# Patient Record
Sex: Female | Born: 1970 | Hispanic: Yes | Marital: Single | Smoking: Current every day smoker
Health system: Southern US, Community
[De-identification: ages and names within clinical notes are randomized; demographics above are authoritative.]

---

## 2015-08-03 ENCOUNTER — Ambulatory Visit (INDEPENDENT_AMBULATORY_CARE_PROVIDER_SITE_OTHER): Payer: Worker's Compensation | Admitting: Emergency Medicine

## 2015-08-03 ENCOUNTER — Ambulatory Visit: Payer: Worker's Compensation

## 2015-08-03 VITALS — BP 98/64 | HR 77 | Temp 98.0°F | Resp 16 | Ht 62.5 in | Wt 162.0 lb

## 2015-08-03 DIAGNOSIS — M545 Low back pain, unspecified: Secondary | ICD-10-CM

## 2015-08-03 DIAGNOSIS — M79641 Pain in right hand: Secondary | ICD-10-CM | POA: Diagnosis not present

## 2015-08-03 MED ORDER — NAPROXEN SODIUM 550 MG PO TABS: 550.0000 mg | ORAL_TABLET | Freq: Two times a day (BID) | ORAL | Status: DC

## 2015-08-03 NOTE — Progress Notes (Signed)
Subjective:  Patient ID: Roberta Barnes, female    DOB: 11/23/1970  Age: 45 y.o. MRN: [id number]  CC: tail bone and Arm Injury   HPI Roberta Barnes presents   Slipped and fell at work she landed on her tail and on her outstretched right hand. She has pain in her coccyx and also in her right base of her thumb pain radiation no numbness tingling or weakness in her legs. She denies any other complaints. She able to ambulate and is not incontinent of urine or stool  History Roberta SimmerFrancisca has no past medical history on file.   She has no past surgical history on file.   Her  family history is not on file.  She   reports that she has been smoking Cigarettes.  She has a 1.2 pack-year smoking history. She does not have any smokeless tobacco history on file. Her alcohol and drug histories are not on file.  No outpatient prescriptions prior to visit.   No facility-administered medications prior to visit.    Social History   Social History  . Marital Status: Single    Spouse Name: N/A  . Number of Children: N/A  . Years of Education: N/A   Social History Main Topics  . Smoking status: Current Every Day Smoker -- 0.30 packs/day for 4 years    Types: Cigarettes  . Smokeless tobacco: None  . Alcohol Use: None  . Drug Use: None  . Sexual Activity: Not Asked   Other Topics Concern  . None   Social History Narrative  . None     Review of Systems  Constitutional: Negative for fever, chills and appetite change.  HENT: Negative for congestion, ear pain, postnasal drip, sinus pressure and sore throat.   Eyes: Negative for pain and redness.  Respiratory: Negative for cough, shortness of breath and wheezing.   Cardiovascular: Negative for leg swelling.  Gastrointestinal: Negative for nausea, vomiting, abdominal pain, diarrhea, constipation and blood in stool.  Endocrine: Negative for polyuria.  Genitourinary: Negative for dysuria, urgency, frequency and flank  pain.  Musculoskeletal: Positive for back pain. Negative for gait problem.  Skin: Negative for rash.  Neurological: Negative for weakness and headaches.  Psychiatric/Behavioral: Negative for confusion and decreased concentration. The patient is not nervous/anxious.     Objective:  BP 98/64 mmHg  Pulse 77  Temp(Src) 98 F (36.7 C) (Oral)  Resp 16  Ht 5' 2.5" (1.588 m)  Wt 162 lb (73.483 kg)  BMI 29.14 kg/m2  SpO2 98%  LMP 07/27/2015  Physical Exam  Constitutional: She is oriented to person, place, and time. She appears well-developed and well-nourished. No distress.  HENT:  Head: Normocephalic and atraumatic.  Right Ear: External ear normal.  Left Ear: External ear normal.  Nose: Nose normal.  Eyes: Conjunctivae and EOM are normal. Pupils are equal, round, and reactive to light. No scleral icterus.  Neck: Normal range of motion. Neck supple. No tracheal deviation present.  Cardiovascular: Normal rate, regular rhythm and normal heart sounds.   Pulmonary/Chest: Effort normal. No respiratory distress. She has no wheezes. She has no rales.  Abdominal: She exhibits no mass. There is no tenderness. There is no rebound and no guarding.  Musculoskeletal: She exhibits no edema.       Lumbar back: She exhibits tenderness.   She has pain and tenderness in base of her thumb and guards. She has no crepitus. There is moderate swelling  Lymphadenopathy:    She has no cervical adenopathy.  Neurological:  She is alert and oriented to person, place, and time. Coordination normal.  Skin: Skin is warm and dry. No rash noted.  Psychiatric: She has a normal mood and affect. Her behavior is normal.      Assessment & Plan:   Roberta Barnes was seen today for tail bone and arm injury.  Diagnoses and all orders for this visit:  Right-sided low back pain without sciatica -     DG Hand Complete Right; Future -     DG Sacrum/Coccyx; Future  Pain of right hand -     DG Hand Complete Right; Future -      DG Sacrum/Coccyx; Future  Other orders -     naproxen sodium (ANAPROX DS) 550 MG tablet; Take 1 tablet (550 mg total) by mouth 2 (two) times daily with a meal.  I am having Roberta Barnes start on naproxen sodium. I am also having her maintain her HYDROcodone-homatropine.  Meds ordered this encounter  Medications  . HYDROcodone-homatropine (HYCODAN) 5-1.5 MG/5ML syrup    Sig: Take 5 mLs by mouth every 6 (six) hours as needed for cough.  . naproxen sodium (ANAPROX DS) 550 MG tablet    Sig: Take 1 tablet (550 mg total) by mouth 2 (two) times daily with a meal.    Dispense:  40 tablet    Refill:  0    Appropriate red flag conditions were discussed with the patient as well as actions that should be taken.  Patient expressed his understanding.  Follow-up: Return in about 1 week (around 08/10/2015).  Carmelina Dane, MD   [facility] reading (PRIMARY) by  Dr. Dareen Piano.  negative.

## 2015-08-03 NOTE — Patient Instructions (Signed)
Lesin en el cccix (Tailbone Injury) El cccix es el hueso pequeo que se encuentra en el extremo inferior de la columna vertebral. Una lesin en el cccix puede incluir la distensin de los ligamentos, hematomas o un hueso quebrado (fractura). Las lesiones en el cccix pueden ser Chesapeake Energydolorosas, y algunas tardan mucho tiempo en curarse. CAUSAS A menudo, esta afeccin se debe a una Emerson Electriccada sobre el cccix. Otras causas son las siguientes:  Esfuerzo o friccin reiterados por Education officer, environmentalrealizar acciones tales Engineer, materialscomo remar y Lobbyistandar en bicicleta.  El Lyndonparto. En algunos casos, es posible que la causa no se conozca. FACTORES DE RIESGO Esta afeccin es ms comn en las mujeres que en los hombres. SNTOMAS Los sntomas de esta afeccin incluyen lo siguiente:  Dolor en la parte baja de la espalda, especialmente al sentarse.  Dolor o dificultad al ponerse de pie despus de estar sentado.  Hematomas en la zona del cccix.  Dolor al defecar.  En las mujeres, dolor durante el coito. DIAGNSTICO Esta afeccin se puede diagnosticar en funcin de los sntomas y de un examen fsico. Si se sospecha que hay una fractura, se pueden tomar radiografas. Tambin pueden hacerle otros estudios, como una tomografa computarizada (TC) o una resonancia magntica (RM). TRATAMIENTO Esta afeccin se puede tratar con medicamentos para ayudar a Engineer, materialsaliviar el dolor. La mayora de las lesiones en el cccix se curan solas en el trmino de 4 a 6semanas. Sin embargo, Museum/gallery conservatorel tiempo de recuperacin puede ser ms prolongado si la lesin incluye Market researcheruna fractura. INSTRUCCIONES PARA EL CUIDADO EN EL HOGAR  Tome los medicamentos solamente como se lo haya indicado el mdico.  Si se lo indican, aplique hielo sobre la zona lesionada:  Ponga el hielo en una bolsa plstica.  Coloque una toalla entre la piel y la bolsa de hielo.  Deje el hielo durante 20minutos, 2 a 3veces por da, durante los primeros 1 o 2das.  Sintese sobre un aro o un almohadn  grande de goma o inflable para Engineer, materialsaliviar el dolor. Inclnese hacia adelante cuando est sentado para ayudar a Altria Groupaliviar las molestias.  Evite estar sentado durante largos perodos.  Aumente la actividad a medida que el dolor se lo permita. Haga los ejercicios que el mdico o el fisioterapeuta le recomienden.  Si tiene dolor al Tesoro Corporationdefecar, tome un laxante emoliente como se lo haya indicado el mdico.  Consuma una dieta con gran cantidad de fibra para ayudar a evitar el estreimiento.  Concurra a todas las visitas de control como se lo haya indicado el mdico. Esto es importante. PREVENCIN Use elementos acolchados y equipo deportivo cuando ande en bicicleta o reme. Esto puede ayudar a evitar que tenga una lesin causada por el esfuerzo o la friccin reiterados. SOLICITE ATENCIN MDICA SI:  El dolor empeora.  Siente muchas molestias al Tesoro Corporationdefecar.  No puede defecar.  Tiene prdidas de orina que no puede controlar (incontinencia urinaria).  Tiene fiebre.   Esta informacin no tiene Theme park managercomo fin reemplazar el consejo del mdico. Asegrese de hacerle al mdico cualquier pregunta que tenga.   Document Released: 04/25/2005 Document Revised: 11/30/2014 Elsevier Interactive Patient Education [employer]2016 Elsevier Inc.

## 2015-08-10 ENCOUNTER — Ambulatory Visit (INDEPENDENT_AMBULATORY_CARE_PROVIDER_SITE_OTHER): Payer: Worker's Compensation | Admitting: Emergency Medicine

## 2015-08-10 ENCOUNTER — Ambulatory Visit: Payer: Worker's Compensation

## 2015-08-10 VITALS — BP 102/68 | HR 85 | Temp 98.8°F | Wt 162.0 lb

## 2015-08-10 DIAGNOSIS — M25531 Pain in right wrist: Secondary | ICD-10-CM | POA: Diagnosis not present

## 2015-08-10 DIAGNOSIS — S63601D Unspecified sprain of right thumb, subsequent encounter: Secondary | ICD-10-CM | POA: Diagnosis not present

## 2015-08-10 NOTE — Progress Notes (Signed)
Subjective:  Patient ID: Roberta Barnes, female    DOB: June 28, 1971  Age: 45 y.o. MRN: [id number]  CC: Follow-up and Wrist Pain   HPI Roberta Barnes presents  patients come back on workman's comp visit she had injured her right wrist and low back in a fall a week ago. Progressed well from the standpoint of her back pain. That's largely resolved she has no numbness tingling or weakness or radicular pain.  Her wrist is still painful even though she is using a thumb spica splint. It does limit her activity. She is more cough when she is in the splint that out.  History Roberta Barnes has no past medical history on file.   She has no past surgical history on file.   Her  family history is not on file.  She   reports that she has been smoking Cigarettes.  She has a 1.2 pack-year smoking history. She does not have any smokeless tobacco history on file. Her alcohol and drug histories are not on file.  Outpatient Prescriptions Prior to Visit  Medication Sig Dispense Refill  . HYDROcodone-homatropine (HYCODAN) 5-1.5 MG/5ML syrup Take 5 mLs by mouth every 6 (six) hours as needed for cough.    . naproxen sodium (ANAPROX DS) 550 MG tablet Take 1 tablet (550 mg total) by mouth 2 (two) times daily with a meal. 40 tablet 0   No facility-administered medications prior to visit.    Social History   Social History  . Marital Status: Single    Spouse Name: N/A  . Number of Children: N/A  . Years of Education: N/A   Social History Main Topics  . Smoking status: Current Every Day Smoker -- 0.30 packs/day for 4 years    Types: Cigarettes  . Smokeless tobacco: None  . Alcohol Use: None  . Drug Use: None  . Sexual Activity: Not Asked   Other Topics Concern  . None   Social History Narrative     Review of Systems  Constitutional: Negative for fever, chills and appetite change.  HENT: Negative for congestion, ear pain, postnasal drip, sinus pressure and sore throat.    Eyes: Negative for pain and redness.  Respiratory: Negative for cough, shortness of breath and wheezing.   Cardiovascular: Negative for leg swelling.  Gastrointestinal: Negative for nausea, vomiting, abdominal pain, diarrhea, constipation and blood in stool.  Endocrine: Negative for polyuria.  Genitourinary: Negative for dysuria, urgency, frequency and flank pain.  Musculoskeletal: Negative for gait problem.  Skin: Negative for rash.  Neurological: Negative for weakness and headaches.  Psychiatric/Behavioral: Negative for confusion and decreased concentration. The patient is not nervous/anxious.     Objective:  BP 102/68 mmHg  Pulse 85  Temp(Src) 98.8 F (37.1 C) (Oral)  Wt 162 lb (73.483 kg)  SpO2 98%  LMP 07/27/2015  Physical Exam  Constitutional: She is oriented to person, place, and time. She appears well-developed and well-nourished.  HENT:  Head: Normocephalic and atraumatic.  Eyes: Conjunctivae are normal. Pupils are equal, round, and reactive to light.  Pulmonary/Chest: Effort normal.  Musculoskeletal: She exhibits no edema.       Right wrist: She exhibits tenderness. She exhibits no swelling, no effusion, no crepitus and no deformity.  Neurological: She is alert and oriented to person, place, and time.  Skin: Skin is dry.  Psychiatric: She has a normal mood and affect. Her behavior is normal. Thought content normal.   She has tenderness over the anatomic snuffbox.   Assessment & Plan:  Roberta Barnes was seen today for follow-up and wrist pain.  Diagnoses and all orders for this visit:  Right wrist pain -     DG Wrist Complete Right; Future  Sprain, thumb, right, subsequent encounter   I am having Roberta Barnes maintain her HYDROcodone-homatropine and naproxen sodium.  No orders of the defined types were placed in this encounter.    Appropriate red flag conditions were discussed with the patient as well as actions that should be taken.  Patient  expressed his understanding.  Follow-up: Return in about 1 week (around 08/17/2015).  [name], [name], MD  [facility] reading (PRIMARY) by  Dr. Burna SisAnderson   Negative .

## 2015-08-10 NOTE — Patient Instructions (Signed)
Esguince de pulgar (Thumb Sprain) Un esguince de pulgar es una lesin en una de las bandas de tejido fuertes (ligamentos) que conectan los huesos del Engineer, productionpulgar. El ligamento puede distenderse en exceso o romperse. La rotura puede ser parcial o completa. La gravedad del esguince depende de la magnitud del dao o de la rotura del ligamento. CAUSAS Con frecuencia, el esguince de pulgar se debe a una cada o a un accidente. Si se extienden las manos para atrapar un objeto o para protegerse, la fuerza del impacto puede causar la distensin excesiva del ligamento. Este exceso de tensin tambin puede causar la rotura del ligamento. FACTORES DE RIESGO Es ms probable que esta lesin se produzca en las personas que practican los siguientes deportes:  Aquellos que suponen un riesgo mayor de sufrir cadas, como el esqu.  Aquellos que Consecoincluyen el atrapamiento de un objeto, por ejemplo, el baloncesto. SNTOMAS Los sntomas de esta afeccin incluyen lo siguiente:  Prdida de la movilidad del pulgar.  Hematomas.  Dolor a Insurance claims handlerla palpacin.  Hinchazn. DIAGNSTICO Esta afeccin se diagnostica mediante la historia clnica y un examen fsico. Adems, pueden hacerle una radiografa del pulgar. TRATAMIENTO El tratamiento vara en funcin de la gravedad del esguince. Si se produce la sobreexigencia del ligamento o este est parcialmente roto, el tratamiento suele incluir la inmovilizacin del pulgar durante un lapso de [place] Estatestiempo. Para ello, el mdico colocar una venda, un yeso o una frula para impedir que el pulgar se mueva hasta que se cure. Si la rotura del ligamento es total, tal vez deba someterse a una ciruga para Economistreconectar el ligamento al Dow Chemicalhueso. Despus de la ciruga, Musicianle colocarn un yeso o una frula que no podr quitarse mientras el pulgar se [place]. Adems, el mdico puede sugerirle que haga ejercicios o fisioterapia para Chief Strategy Officerfortalecer el pulgar. INSTRUCCIONES PARA EL CUIDADO EN EL HOGAR Si tiene un  yeso:  No introduzca nada adentro del yeso para rascarse la piel. Esto puede aumentar el riesgo de tener infecciones.  Controle todos los Darden Restaurantsdas la piel de alrededor del yeso. Informe al mdico cualquier inquietud que tenga. Puede aplicar una locin en la piel seca alrededor de los bordes del yeso. No aplique locin en la piel por debajo del yeso.  Mantenga el yeso seco y limpio. Si tiene una frula:  sela como se lo haya indicado el mdico. Qutesela solamente como se lo haya indicado el mdico.  Afloje la frula si los dedos se le entumecen, siente hormigueos o se le enfran y se tornan de Research officer, trade unioncolor azul.  Mantenga la frula limpia y seca. El bao  Cubra la venda, el yeso o la frula con una bolsa de plstico hermtica para protegerlos del agua mientras toma un bao de inmersin o se ducha. No permita que la venda, el yeso o la frula se mojen. Control del dolor, la rigidez y la hinchazn   Si se lo indicaron, aplique hielo sobre la zona de la lesin (salvo que tenga un yeso):  Ponga el hielo en una bolsa plstica.  Coloque una toalla entre la piel y la bolsa de hielo.  Coloque el hielo durante 20minutos, 2 a 3veces por Futures traderda.  Mueva los dedos con frecuencia para evitar la rigidez y reducir la hinchazn.  Cuando est sentado o acostado, eleve la zona de la lesin por encima del nivel del corazn. Conducir  No conduzca ni opere maquinaria pesada mientras toma analgsicos.  No conduzca mientras Botswanausa un yeso o una frula en la mano. Instrucciones generales  sentado o acostado, eleve la zona de la lesin por encima del nivel del corazn.  Conducir   No conduzca ni opere maquinaria pesada mientras toma analgsicos.   No conduzca mientras usa un yeso o una frula en la mano.  Instrucciones generales   No ejerza presin en ninguna parte del yeso o de la frula hasta que se haya endurecido. Esto puede tomar varias horas.   Tome los medicamentos solamente como se lo haya indicado el mdico. Estos incluyen los medicamentos recetados y de venta libre.   Concurra a todas las visitas de control como se lo haya indicado el mdico. Esto es importante.   Haga ejercicios o fisioterapia como se lo haya indicado el mdico.   No use anillos  en el pulgar lesionado.  SOLICITE ATENCIN MDICA SI:   El dolor no se alivia con los medicamentos.   Los hematomas o la hinchazn empeoran.   El yeso o la frula se daan.  SOLICITE ATENCIN MDICA DE INMEDIATO SI:   El pulgar est entumecido o de color azul.   El pulgar est ms fro de lo normal.     Esta informacin no tiene como fin reemplazar el consejo del mdico. Asegrese de hacerle al mdico cualquier pregunta que tenga.     Document Released: 07/16/2005 Document Revised: 11/30/2014  Elsevier Interactive Patient Education 2016 Elsevier Inc.

## 2015-08-17 ENCOUNTER — Ambulatory Visit: Payer: Worker's Compensation

## 2015-08-17 ENCOUNTER — Ambulatory Visit (INDEPENDENT_AMBULATORY_CARE_PROVIDER_SITE_OTHER): Payer: Worker's Compensation | Admitting: Family Medicine

## 2015-08-17 VITALS — BP 98/60 | HR 71 | Temp 98.4°F | Resp 12 | Ht 63.0 in | Wt 163.4 lb

## 2015-08-17 DIAGNOSIS — M25531 Pain in right wrist: Secondary | ICD-10-CM

## 2015-08-17 DIAGNOSIS — M5442 Lumbago with sciatica, left side: Secondary | ICD-10-CM | POA: Diagnosis not present

## 2015-08-17 DIAGNOSIS — T148 Other injury of unspecified body region: Secondary | ICD-10-CM

## 2015-08-17 DIAGNOSIS — T148XXA Other injury of unspecified body region, initial encounter: Secondary | ICD-10-CM

## 2015-08-17 MED ORDER — CYCLOBENZAPRINE HCL 5 MG PO TABS: 5.0000 mg | ORAL_TABLET | Freq: Every day | ORAL | Status: AC

## 2015-08-17 MED ORDER — NAPROXEN SODIUM 550 MG PO TABS: 550.0000 mg | ORAL_TABLET | Freq: Two times a day (BID) | ORAL | Status: AC

## 2015-08-17 NOTE — Progress Notes (Signed)
      Chief Complaint:  Chief Complaint  Patient presents with  . Follow-up    Workers comp. right wrist sprain    HPI: Roberta Barnes is a 45 y.o. female who reports to [facility] today complaining of right wirst pain and thumb pain, while wearing wrist brace pain is 90 % improved.The back pain is still hurting her. In the last 3 days she has had more pain. She is working without restrictions for back. When she moves or works too much it hurts and when she sits or walkd too much  then she has back paina dn some numbness in back and down left leg.  No incontinence. Was seen here initially on 08/03/2015  HPI Roberta Barnes presents patients come back on workman's comp visit she had injured her right wrist and low back in a fall a week ago. Progressed well from the standpoint of her back pain. That's largely resolved she has no numbness tingling or weakness or radicular pain. Her wrist is still painful even though she is using a thumb spica splint. It does limit her activity. She is more comfortable when she is in the splint that out.  No Known Allergies   ROS: The patient denies fevers, chills, night sweats, unintentional weight loss, chest pain, palpitations, wheezing, dyspnea on exertion, nausea, vomiting, abdominal pain, dysuria, hematuria, melena, + numbness, weakness, or tingling.   All other systems have been reviewed and were otherwise negative with the exception of those mentioned in the HPI and as above.    PHYSICAL EXAM: Filed Vitals:   08/17/15 0918  BP: 98/60  Pulse: 71  Temp: 98.4 F (36.9 C)  Resp: 12   Body mass index is 28.95 kg/(m^2).   General: Alert, no acute distress HEENT:  Normocephalic, atraumatic, oropharynx patent. EOMI, PERRLA Cardiovascular:  Regular rate and rhythm, no rubs murmurs or gallops.  No Carotid bruits, radial pulse intact. No pedal edema.  Respiratory: Clear to auscultation bilaterally.  No wheezes, rales, or rhonchi.  No  cyanosis, no use of accessory musculature Abdominal: No organomegaly, abdomen is soft and non-tender, positive bowel sounds. No masses. Skin: No rashes. Neurologic: Facial musculature symmetric. Psychiatric: Patient acts appropriately throughout our interaction. Lymphatic: No cervical or submandibular lymphadenopathy Musculoskeletal: Gait intact. No edema, tenderness + paramsk tenderness  Full ROM 5/5 strength, 2/2 DTRs No saddle anesthesia Straight leg positive  Hip and knee exam--normal  LABS: No results found for this or any previous visit.   EKG/XRAY:   Primary read interpreted by Dr. Conley Rolls at [facility]. Neg for any fracture or dislocations  ASSESSMENT/PLAN: Encounter Diagnoses  Name Primary?  . Bilateral low back pain with left-sided sciatica Yes  . Right wrist pain   . Sprain and strain     [name] continue with current work restrictions, advance activities as tolerated. She may wear her wrist brace as needed. Limit lifting, bending twisting pulling pushing if her back hurts, there is no weight restrictions since her back pain is intermittent.  Cont with naproxen prn, rx flexeril prn  Cont to wear wrist brace Fu in 2 weeks  Gross sideeffects, risk and benefits, and alternatives of medications d/w patient. Patient is aware that all medications have potential sideeffects and we are unable to predict every sideeffect or drug-drug interaction that may occur.  Taytem Ghattas DO  08/23/2015 12:22 PM

## 2015-08-31 ENCOUNTER — Ambulatory Visit (INDEPENDENT_AMBULATORY_CARE_PROVIDER_SITE_OTHER): Payer: Worker's Compensation | Admitting: Family Medicine

## 2015-08-31 VITALS — BP 108/70 | HR 83 | Temp 98.0°F | Resp 19 | Ht 65.35 in | Wt 160.6 lb

## 2015-08-31 DIAGNOSIS — M25531 Pain in right wrist: Secondary | ICD-10-CM | POA: Diagnosis not present

## 2015-08-31 DIAGNOSIS — M545 Low back pain, unspecified: Secondary | ICD-10-CM

## 2015-08-31 NOTE — Patient Instructions (Signed)
Pasos para dejar de fumar   (Steps to Quit Smoking)   El tabaco puede ser perjudicial para la salud y afectar casi cualquier órgano del cuerpo. Fumar aumenta el riesgo de sufrir muchas enfermedades crónicas graves, no solo para el fumador, sino para las personas que lo rodean. Dejar de fumar es difícil, pero es una de las mejores cosas que puede hacer por su salud. Nunca es muy tarde para dejar de fumar.  ¿CUÁLES SON LOS BENEFICIOS QUE SE OBTIENEN AL DEJAR DE FUMAR?  Al dejar de fumar, se reduce el riesgo de contraer enfermedades y afecciones graves, como las siguientes:  · Enfermedad o cáncer de pulmón, por ejemplo, EPOC.  · Cardiopatía.  · Ictus.  · Infarto de miocardio.  · Infertilidad.  · Osteoporosis y fracturas en los huesos.  Además, los síntomas como la tos, la sibilancia y la falta de aire pueden mejorar si deja de fumar. También puede notar que se enferma menos, porque el organismo tiene más fuerzas para combatir resfríos e infecciones. Si está embarazada, dejar de fumar la ayudará a reducir las probabilidades de tener un bebé de bajo peso al nacer.  ¿CÓMO ME PREPARO PARA DEJAR DE FUMAR?  Cuando decida dejar de fumar, cree un plan que le asegure un resultado satisfactorio. Antes de dejar de fumar, haga lo siguiente:  · Elija una fecha para dejar de fumar. Fije una fecha que esté dentro de las dos semanas siguientes, así tiene tiempo para prepararse.  · Escriba las razones por las que quiere dejar de fumar. Mantenga esta lista en lugares donde la vea con frecuencia, por ejemplo, en el espejo del baño, en el automóvil o en la billetera.  · Identifique las personas, los lugares, las cosas y las actividades que hacen que tenga deseos de fumar (factores desencadenantes) y evítelos. Asegúrese de tomar estas medidas:    Deseche todos los cigarrillos que haya en su casa, en el trabajo y en su automóvil.    Deseche los elementos relacionados con el cigarrillo, como los ceniceros y los encendedores.    Limpie el  automóvil y asegúrese de vaciar el cenicero.    Limpie su casa, incluidas las cortinas y las alfombras.  · Dígale a sus familiares, amigos y compañeros de trabajo que está dejando de fumar. El apoyo de sus seres queridos puede facilitarle esta tarea.  · Hable con su médico sobre las opciones para dejar de fumar.  · Averigüe las opciones de tratamiento que cubre su seguro médico.  ¿QUÉ ESTRATEGIAS PUEDO UTILIZAR PARA DEJAR DE FUMAR?   Hable con su médico sobre las diferentes estrategias para dejar de fumar. Entre ellas, se incluyen las siguientes:  · Dejar de fumar de forma definitiva en lugar de ir reduciendo gradualmente la cantidad de cigarrillos durante un período. Las investigaciones demuestran que dejar de fumar "en seco" es más exitoso que hacerlo de forma gradual.  · Recibir asesoramiento psicológico individual como ayuda para desarrollar técnicas de resolución de problemas. Es más probable que tenga éxito si asiste a diversas sesiones de asesoramiento psicológico. Incluso las sesiones breves de 10 minutos pueden ser eficaces.  · Hallar recursos y sistemas de apoyo que ayuden a dejar de fumar, y a no retomar el hábito más adelante. Estos recursos son más útiles cuando se los utiliza con frecuencia. Pueden incluir los siguientes:    Charlas en línea con un consejero.    Líneas telefónicas para dejar de fumar.    Materiales impresos de autoayuda.    Grupos de   apoyo o asesoramiento psicológico grupal.    Programas de mensajes de texto.    Aplicaciones para teléfonos celulares.  · Tomar medicamentos como ayuda para dejar de fumar. (Si está embarazada o amamantando, hable con su médico primero). Algunos medicamentos contienen nicotina, pero otros no. Ambos tipos de medicamentos reducen el impulso de fumar, pero los medicamentos con nicotina ayudan a aliviar los síntomas de abstinencia. El médico podrá indicar lo siguiente:    Pastillas, goma de mascar o parches de nicotina.    Inhaladores o aerosoles con  nicotina.    Medicamentos sin nicotina que se toman por vía oral.  Hable con su médico sobre la posibilidad de combinar estrategias, por ejemplo, tomar medicamentos mientras recibe asesoramiento psicológico individual. La combinación de estas dos estrategias aumenta las probabilidades de dejar de fumar en comparación con el uso de cualquiera de ellas de forma aislada.  Si está embarazada o amamantando, pregúntele a su médico cómo hallar asesoramiento psicológico o estrategias de apoyo para dejar de fumar. No tome medicamentos para dejar de fumar a menos que el médico se lo haya indicado.  ¿QUÉ PUEDO HACER PARA QUE DEJAR DE FUMAR SEA MÁS FÁCIL?  Al principio, dejar de fumar puede parecer abrumador, pero hay muchas opciones que facilitan el proceso. Tome estas importantes medidas:  · Comuníquese con su familia y sus amigos, y pídales que lo apoyen y alienten durante este tiempo. Llame a las líneas telefónicas que ayudan a dejar de fumar, póngase en contacto con grupos de apoyo o reciba asesoramiento de un consejero.  · Pídale a la gente que fuma que evite hacerlo cerca suyo.  · Evite los lugares que desencadenan las ganas de fumar, como los bares, las fiestas o las zonas habilitadas para fumar en el trabajo.  · Pase tiempo con personas que no fuman.  · Reduzca el estrés en su vida, ya que este puede ser un factor que desencadena el deseo de fumar en algunas personas. Para reducir el estrés, intente lo siguiente:    Hacer actividad física con regularidad.    Practicar ejercicios de respiración profunda.    Practicar yoga.    Meditar.    Realizar una visualización corporal. Para ello, cierre los ojos, visualice su cuerpo desde la cabeza hasta los dedos de los pies y fíjese qué partes del cuerpo están especialmente tensas. Relaje específicamente los músculos de esas áreas.  · Descargue o compre aplicaciones para teléfonos celulares o tablets que envían recordatorios, consejos y otros recursos de apoyo para respetar el  plan de dejar de fumar. Hay muchas aplicaciones gratuitas, como QuitGuide de los Centros para el Control y la Prevención de Enfermedades (CDC, Centers for Disease Control and Prevention). Puede hallar otros recursos de apoyo para dejar de fumar en smokefree.gov y en otros sitios web.  ¿CÓMO ME SENTIRÉ CUANDO DEJE DE FUMAR?  En las primeras 24 horas después de haber dejado de fumar, quizás tenga algunos síntomas de abstinencia. Por lo general, estos síntomas son más evidentes 2 o 3 días después de dejar de fumar, pero no suelen durar más de 2 o 3 semanas. Entre los cambios o síntomas que podría experimentar, se incluyen los siguientes:  · Cambios en el estado de ánimo.  · Inquietud, ansiedad o irritación.  · Dificultad para concentrarse.  · Mareos.  · Deseo intenso de comer alimentos con azúcar, además de fumar.  · Leve aumento de peso.  · Estreñimiento.  · Náuseas.  · Tos o dolor de garganta.  · Cambios en los efectos de los medicamentos   en el cuerpo.  · Depresión.  · Dificultad para dormir (insomnio).  Después de 2 o 3 semanas de haber dejado de fumar, comenzará a notar resultados más positivos, como los siguientes:  · Mejor sentido del gusto y el olfato.  · Menos tos y dolor de garganta.  · Frecuencia cardíaca más lenta.  · Presión arterial más baja.  · Piel más clara.  · Mayor capacidad para respirar.  · Menos días de licencia por enfermedad.  Dejar de fumar es muy difícil para la mayoría de las personas. No se desanime si no lo logra la primera vez. Algunas personas necesitan intentarlo varias veces antes de lograr dejar de fumar definitivamente. Haga lo posible por respetar el plan para dejar de fumar y hable con su médico si tiene alguna pregunta o inquietud.     Esta información no tiene como fin reemplazar el consejo del médico. Asegúrese de hacerle al médico cualquier pregunta que tenga.     Document Released: 07/16/2005 Document Revised: 11/30/2014  Elsevier Interactive Patient Education ©2016 [employer]

## 2015-08-31 NOTE — Progress Notes (Signed)
   Subjective:    Patient ID: Roberta Barnes, female    DOB: 1971/03/10, 45 y.o.   MRN: [id number] By signing my name below, I, Littie Deeds, attest that this documentation has been prepared under the direction and in the presence of Elvina Sidle, MD.  Electronically Signed: Littie Deeds, Medical Scribe. 08/31/2015. 10:05 AM.  HPI HPI Comments: Roberta Barnes is a 45 y.o. female who presents to the [facility] for a worker's comp follow-up for a fall that occurred at work 1 month ago on 1/3. Patient injured her back and her right wrist at that time. She was initially evaluated on 1/4 and had been seen for this 2 more times since then. She is not taking any medications for the pain. She would like a note to return to work without restrictions.  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  Review of Systems     Objective:   Physical Exam CONSTITUTIONAL: Well developed/well nourished HEAD: Normocephalic/atraumatic EYES: EOM/PERRL ENMT: Mucous membranes moist NECK: supple no meningeal signs SPINE: entire spine nontender. Back normal. CV: S1/S2 noted, no murmurs/rubs/gallops noted LUNGS: Lungs are clear to auscultation bilaterally, no apparent distress ABDOMEN: soft, nontender, no rebound or guarding GU: no cva tenderness NEURO: Pt is awake/alert, moves all extremitiesx4 EXTREMITIES: pulses normal, full ROM. Right wrist normal. SKIN: warm, color normal PSYCH: no abnormalities of mood noted        Assessment & Plan:  Result back and right wrist pain. May return to work with no restrictions  Signed, Sheila Oats.D.

## 2017-01-06 IMAGING — CR DG WRIST COMPLETE 3+V*R*
2 series · 2 of 2 positions shown · non-contrast
Comparison: 08/03/2015

CLINICAL DATA: Right wrist pain, subsequent encounter

EXAM:
RIGHT WRIST - COMPLETE 3+ VIEW

[PA]
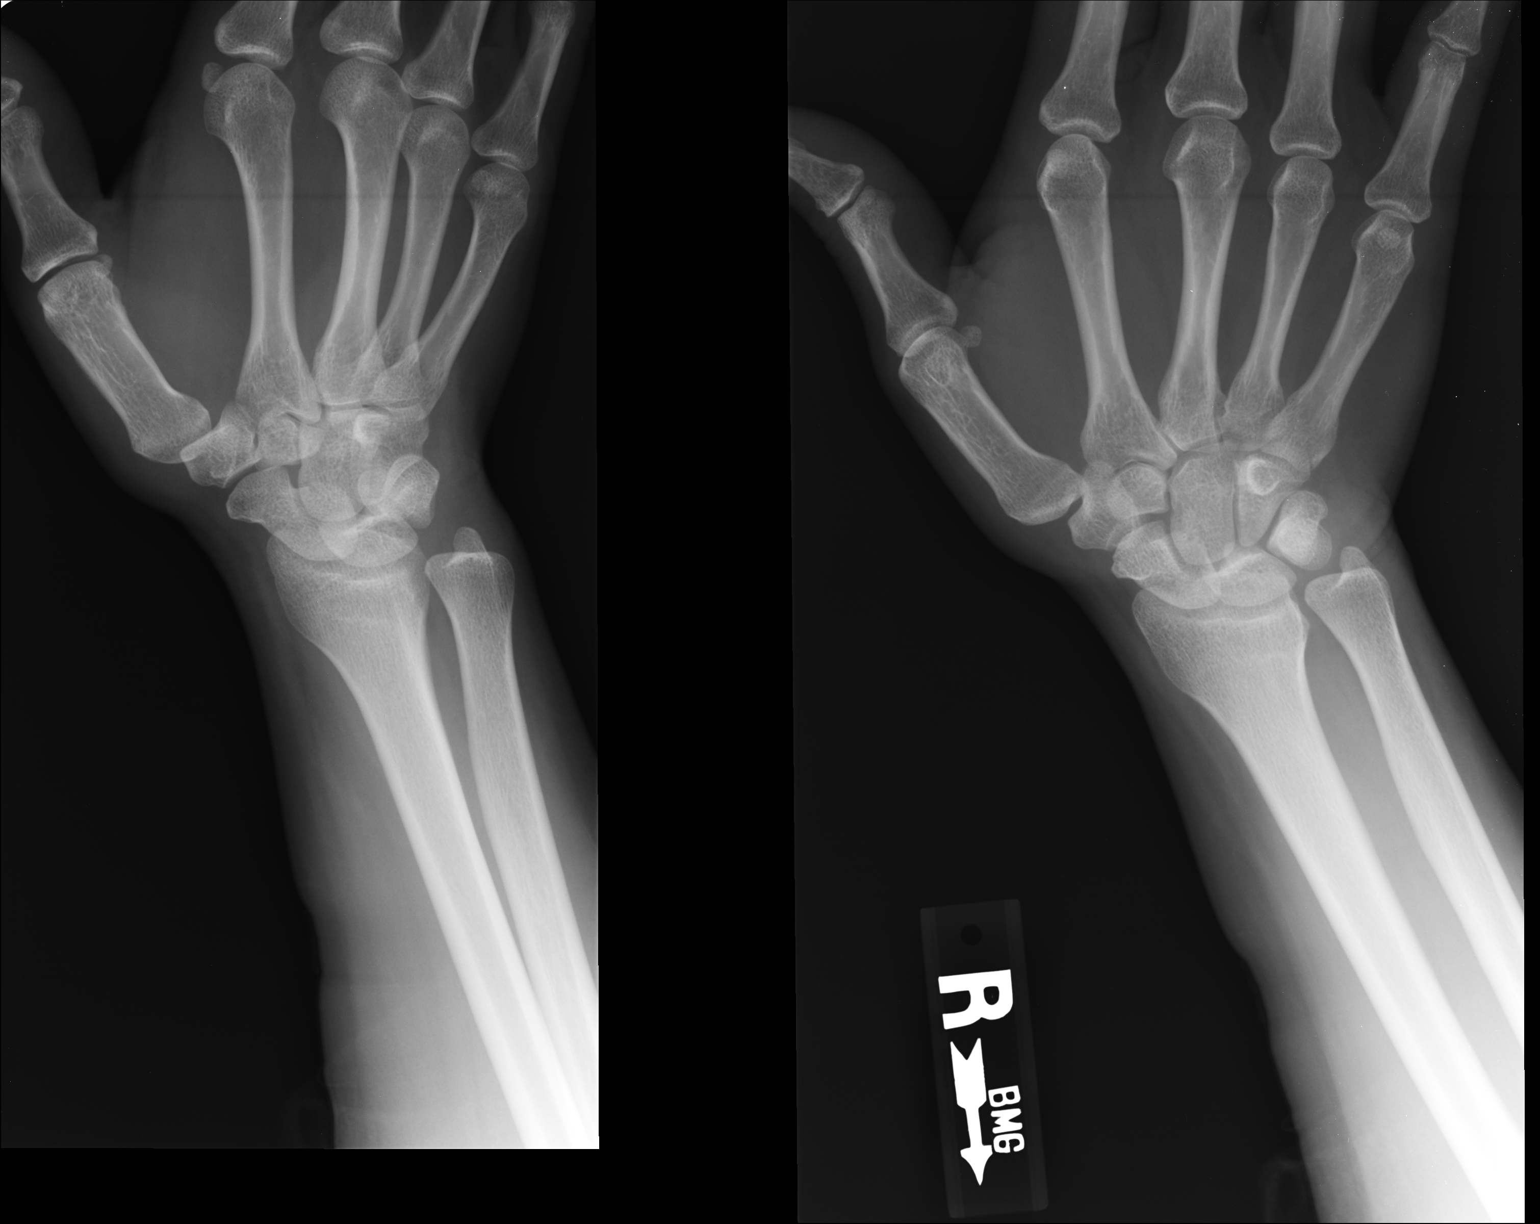

[lateral]
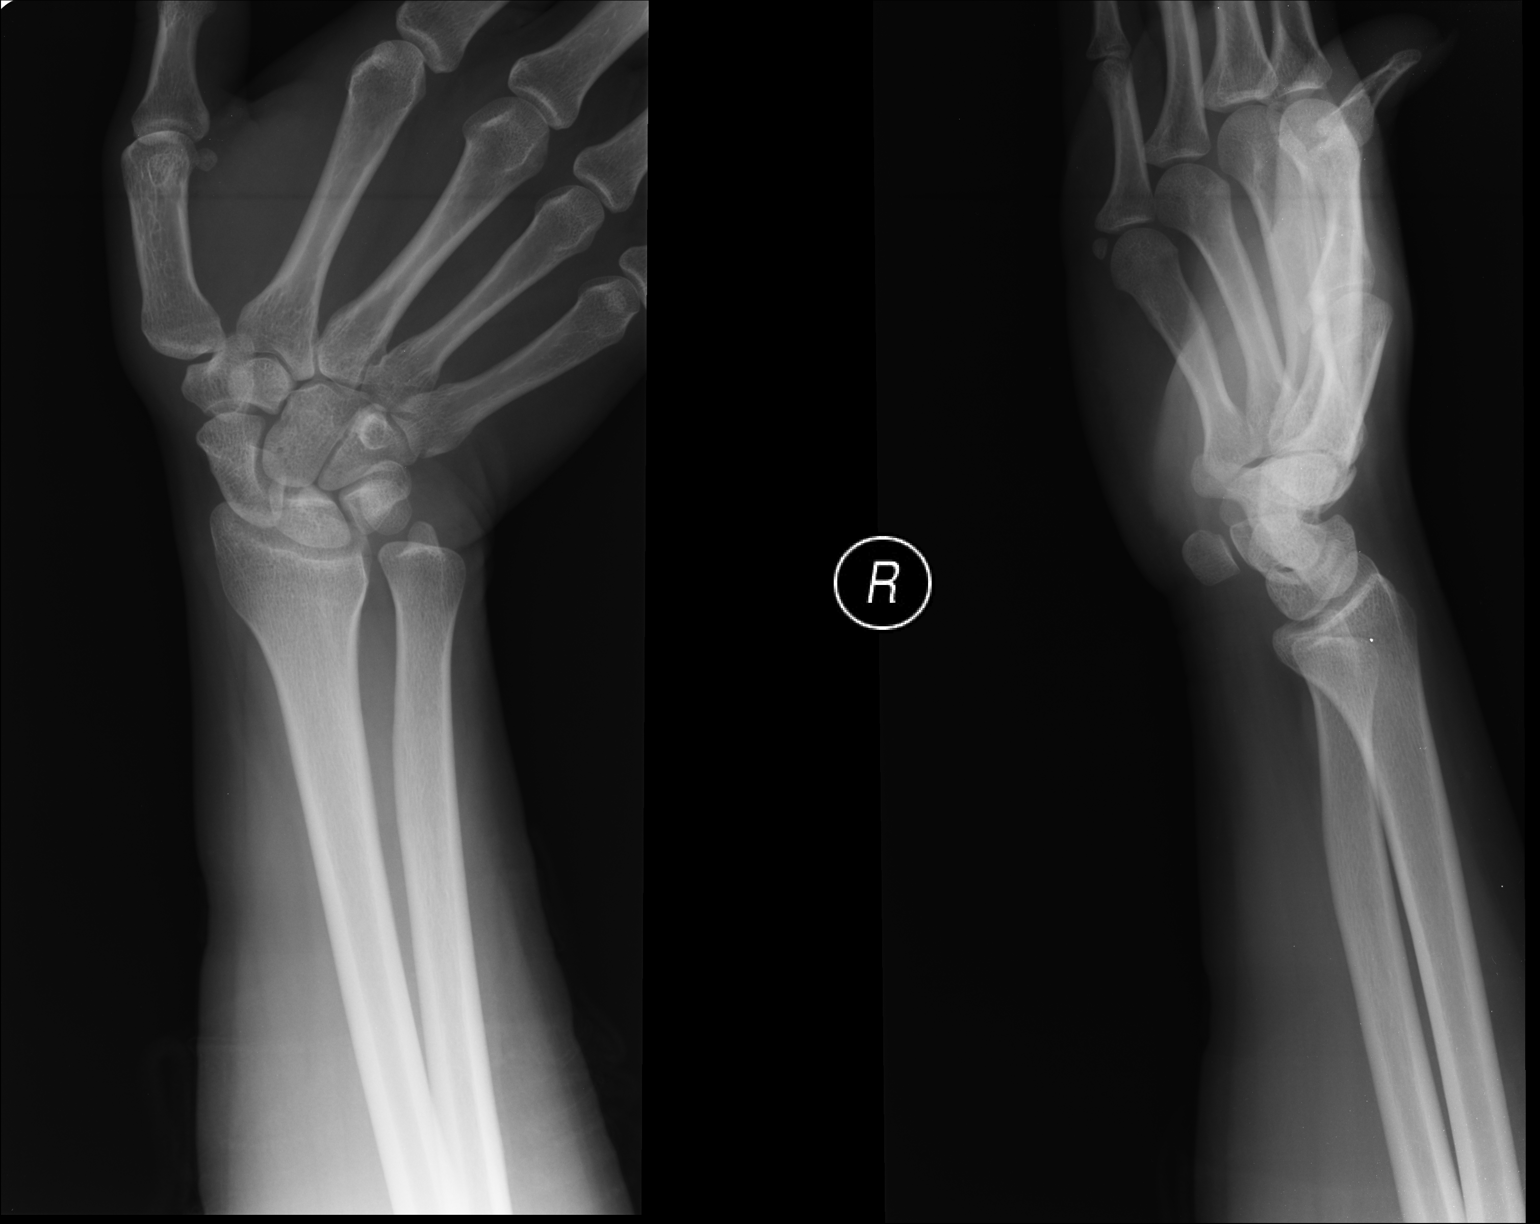

[2 of 2 positions shown; findings below may reference images not displayed]

FINDINGS: There is no evidence of fracture or dislocation. There is no
evidence of arthropathy or other focal bone abnormality. Soft
tissues are unremarkable.
IMPRESSION: No acute abnormality noted.

## 2017-01-13 IMAGING — CR DG LUMBAR SPINE COMPLETE 4+V
5 series · 5 of 5 positions shown · non-contrast
Comparison: None.

CLINICAL DATA: Lumbago following fall

EXAM:
LUMBAR SPINE - COMPLETE 4+ VIEW

[AP (1 of 2)]
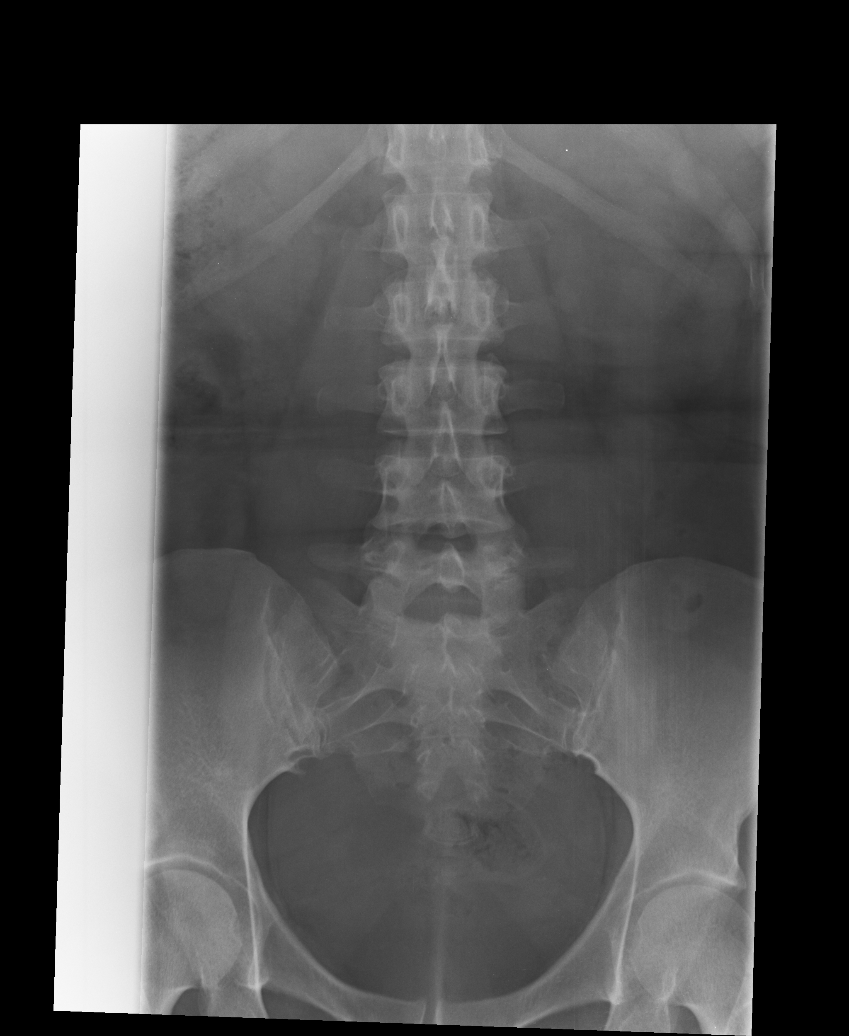

[AP (2 of 2)]
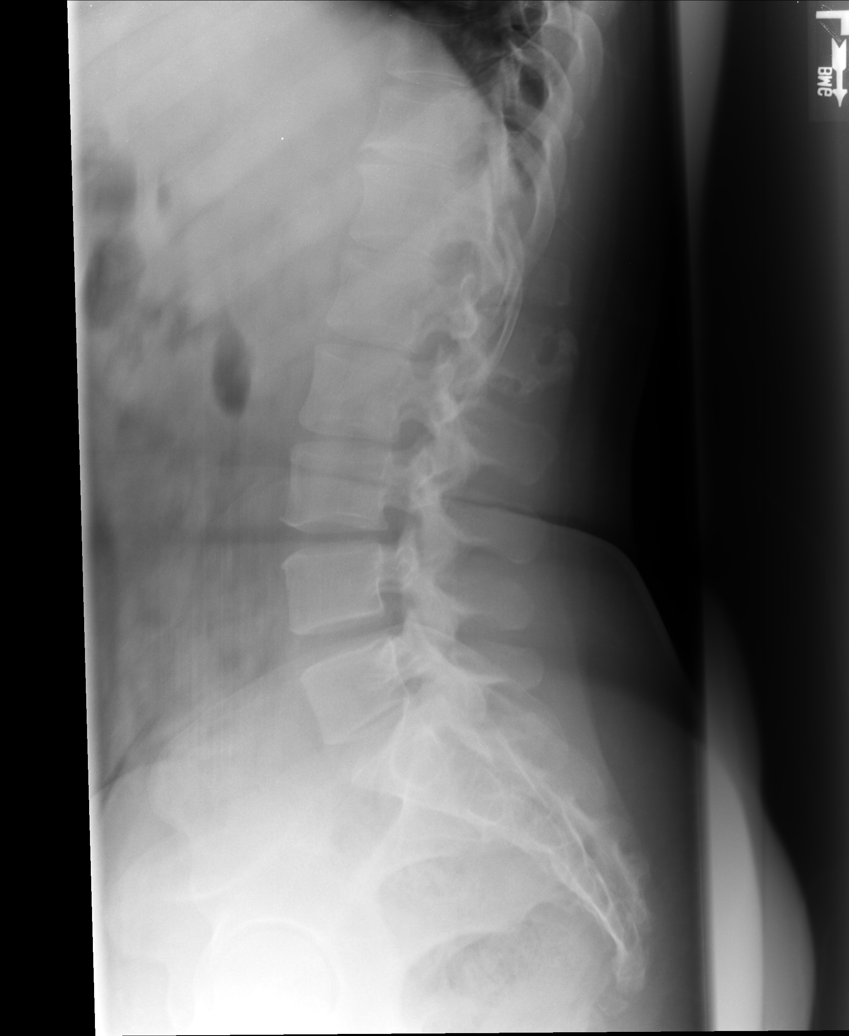

[l5 s1]
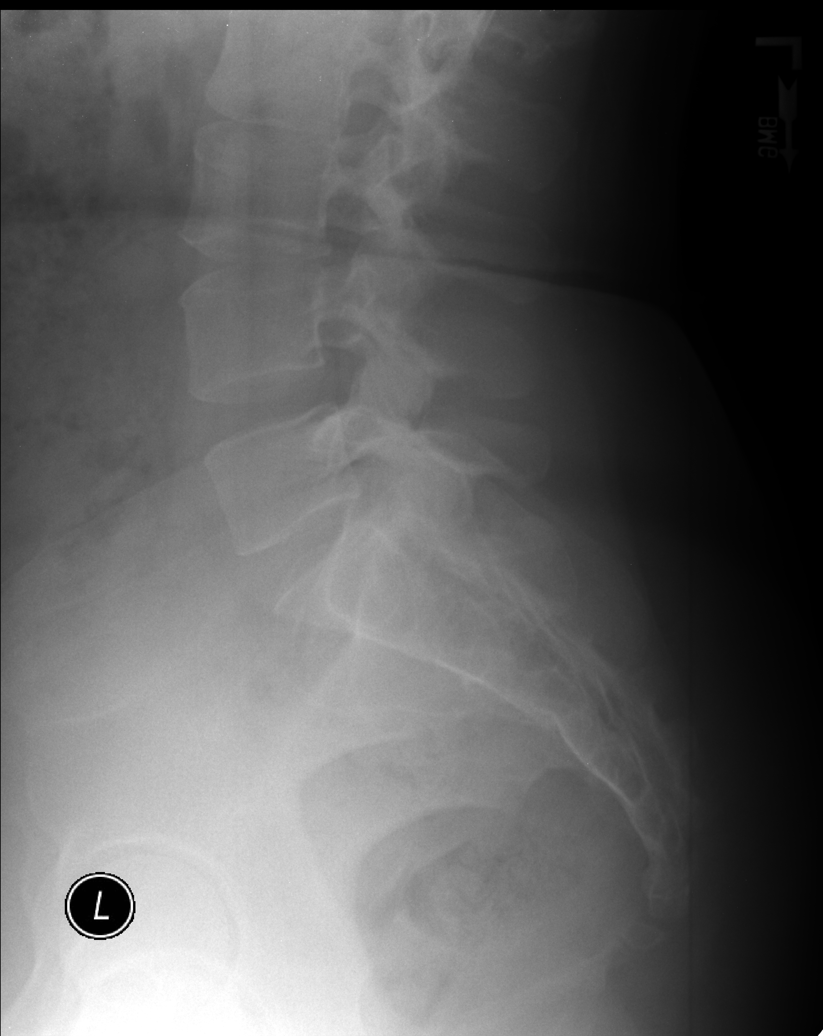

[rpo]
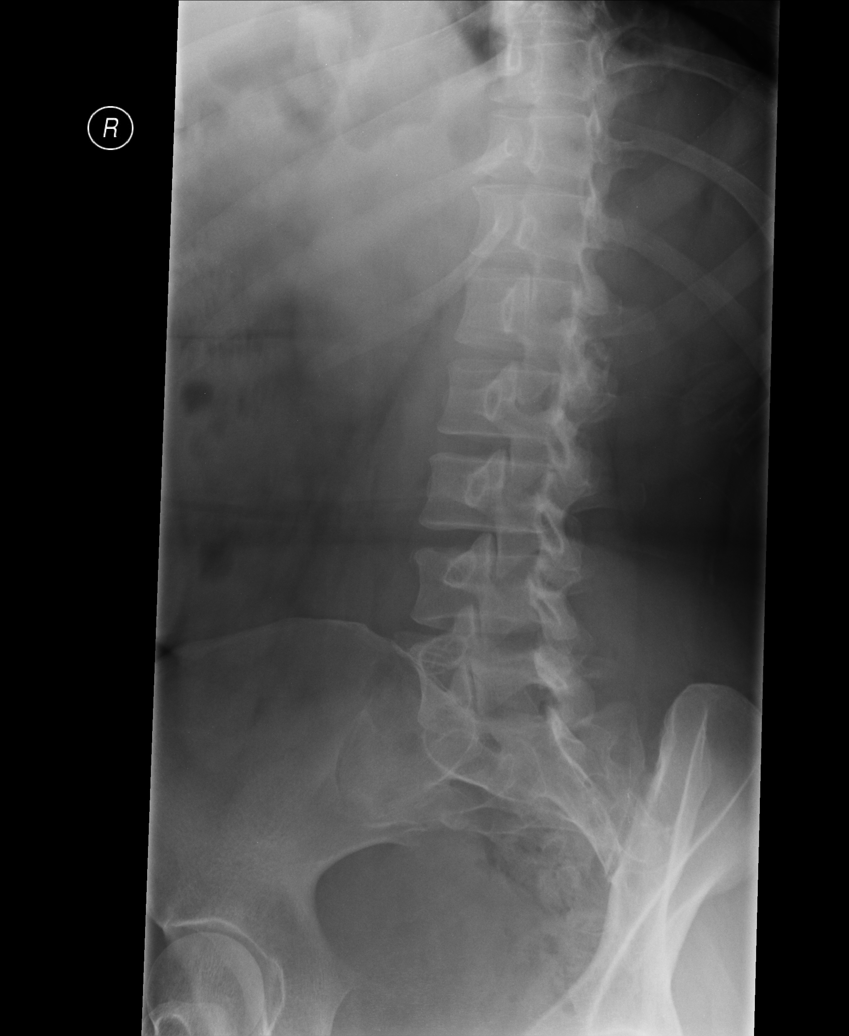

[lpo]
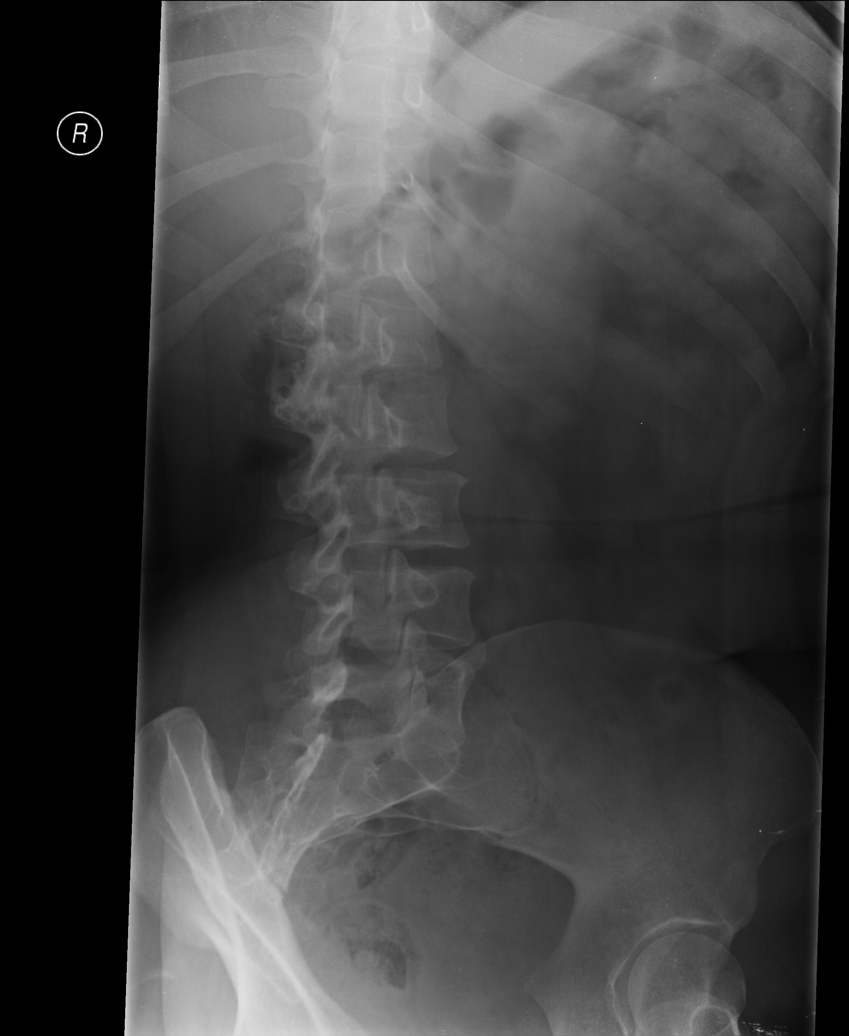

[5 of 5 positions shown; findings below may reference images not displayed]

FINDINGS: Frontal, lateral, spot lumbosacral lateral, and bilateral oblique
views were obtained. There are 5 non-rib-bearing lumbar type
vertebral bodies. There is no fracture or spondylolisthesis. The
disc spaces appear normal. There is mild facet osteoarthritic change
on the left at L5-S1. Other facets appear normal.
IMPRESSION: Mild facet osteoarthritic change at L5-S1 on the left. No other
arthropathy appreciable. No fracture or spondylolisthesis.
